# Patient Record
Sex: Female | Born: 2000 | Race: White | Hispanic: No | Marital: Single | Smoking: Never smoker
Health system: Southern US, Community
[De-identification: ages and names within clinical notes are randomized; demographics above are authoritative.]

## PROBLEM LIST (undated history)

## (undated) DIAGNOSIS — E213 Hyperparathyroidism, unspecified: Secondary | ICD-10-CM

## (undated) DIAGNOSIS — E559 Vitamin D deficiency, unspecified: Secondary | ICD-10-CM

## (undated) DIAGNOSIS — M5126 Other intervertebral disc displacement, lumbar region: Secondary | ICD-10-CM

## (undated) DIAGNOSIS — E282 Polycystic ovarian syndrome: Secondary | ICD-10-CM

## (undated) DIAGNOSIS — E538 Deficiency of other specified B group vitamins: Secondary | ICD-10-CM

## (undated) DIAGNOSIS — N200 Calculus of kidney: Secondary | ICD-10-CM

## (undated) DIAGNOSIS — F419 Anxiety disorder, unspecified: Secondary | ICD-10-CM

## (undated) HISTORY — PX: PARATHYROIDECTOMY: SHX19

## (undated) HISTORY — DX: Vitamin D deficiency, unspecified: E55.9

## (undated) HISTORY — DX: Hyperparathyroidism, unspecified: E21.3

## (undated) HISTORY — DX: Other intervertebral disc displacement, lumbar region: M51.26

## (undated) HISTORY — DX: Polycystic ovarian syndrome: E28.2

## (undated) HISTORY — DX: Deficiency of other specified B group vitamins: E53.8

## (undated) HISTORY — DX: Anxiety disorder, unspecified: F41.9

---
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
## 2018-10-22 ENCOUNTER — Emergency Department (HOSPITAL_COMMUNITY)
Admission: EM | Admit: 2018-10-22 | Discharge: 2018-10-23 | Disposition: A | Discharge status: Home / Self Care | Payer: Medicaid - Out of State | Attending: Emergency Medicine | Admitting: Emergency Medicine

## 2018-10-22 ENCOUNTER — Encounter (HOSPITAL_COMMUNITY): Payer: Self-pay | Admitting: Emergency Medicine

## 2018-10-22 ENCOUNTER — Other Ambulatory Visit: Payer: Self-pay

## 2018-10-22 DIAGNOSIS — R109 Unspecified abdominal pain: Secondary | ICD-10-CM | POA: Insufficient documentation

## 2018-10-22 DIAGNOSIS — R112 Nausea with vomiting, unspecified: Secondary | ICD-10-CM

## 2018-10-22 DIAGNOSIS — R1013 Epigastric pain: Secondary | ICD-10-CM

## 2018-10-22 DIAGNOSIS — Z91040 Latex allergy status: Secondary | ICD-10-CM | POA: Diagnosis not present

## 2018-10-22 DIAGNOSIS — Z79899 Other long term (current) drug therapy: Secondary | ICD-10-CM | POA: Insufficient documentation

## 2018-10-22 LAB — COMPREHENSIVE METABOLIC PANEL
ALT: 33 U/L (ref 0–44)
AST: 20 U/L (ref 15–41)
Albumin: 4.4 g/dL (ref 3.5–5.0)
Alkaline Phosphatase: 100 U/L (ref 38–126)
Anion gap: 10 (ref 5–15)
BUN: 13 mg/dL (ref 6–20)
CO2: 19 mmol/L — ABNORMAL LOW (ref 22–32)
Calcium: 8.9 mg/dL (ref 8.9–10.3)
Chloride: 108 mmol/L (ref 98–111)
Creatinine, Ser: 0.65 mg/dL (ref 0.44–1.00)
GFR calc Af Amer: >60 mL/min (ref >60)
GFR calc non Af Amer: >60 mL/min (ref >60)
Glucose, Bld: 120 mg/dL — ABNORMAL HIGH (ref 70–99)
Potassium: 3.6 mmol/L (ref 3.5–5.1)
Sodium: 137 mmol/L (ref 135–145)
Total Bilirubin: 1.3 mg/dL — ABNORMAL HIGH (ref 0.3–1.2)
Total Protein: 7.9 g/dL (ref 6.5–8.1)

## 2018-10-22 LAB — LIPASE, BLOOD: Lipase: 22 U/L (ref 11–51)

## 2018-10-22 LAB — CBC WITH DIFFERENTIAL/PLATELET
Abs Immature Granulocytes: 0.04 10*3/uL (ref 0.00–0.07)
Basophils Absolute: 0 10*3/uL (ref 0.0–0.1)
Basophils Relative: 0 %
Eosinophils Absolute: 0 10*3/uL (ref 0.0–0.5)
Eosinophils Relative: 0 %
HCT: 45.7 % (ref 36.0–46.0)
Hemoglobin: 15.8 g/dL — ABNORMAL HIGH (ref 12.0–15.0)
Immature Granulocytes: 0 %
Lymphocytes Relative: 6 %
Lymphs Abs: 0.7 10*3/uL (ref 0.7–4.0)
MCH: 31.9 pg (ref 26.0–34.0)
MCHC: 34.6 g/dL (ref 30.0–36.0)
MCV: 92.3 fL (ref 80.0–100.0)
Monocytes Absolute: 0.6 10*3/uL (ref 0.1–1.0)
Monocytes Relative: 5 %
Neutro Abs: 9.8 10*3/uL — ABNORMAL HIGH (ref 1.7–7.7)
Neutrophils Relative %: 89 %
Platelets: 327 10*3/uL (ref 150–400)
RBC: 4.95 MIL/uL (ref 3.87–5.11)
RDW: 13.9 % (ref 11.5–15.5)
WBC: 11.2 10*3/uL — ABNORMAL HIGH (ref 4.0–10.5)
nRBC: 0 % (ref 0.0–0.2)

## 2018-10-22 MED ORDER — SODIUM CHLORIDE 0.9 % IV BOLUS
1000.0000 mL | Freq: Once | INTRAVENOUS | Status: AC
  Administered 2018-10-22: 1000 mL via INTRAVENOUS

## 2018-10-22 MED ORDER — SODIUM CHLORIDE 0.9 % IV SOLN
INTRAVENOUS | Status: DC
  Administered 2018-10-23: via INTRAVENOUS

## 2018-10-22 MED ORDER — ONDANSETRON HCL 4 MG/2ML IJ SOLN
4.0000 mg | Freq: Once | INTRAMUSCULAR | Status: AC
  Administered 2018-10-22: 4 mg via INTRAVENOUS
  Filled 2018-10-22: qty 2

## 2018-10-22 MED ORDER — FENTANYL CITRATE (PF) 100 MCG/2ML IJ SOLN
50.0000 ug | Freq: Once | INTRAMUSCULAR | Status: AC
  Administered 2018-10-22: 50 ug via INTRAVENOUS
  Filled 2018-10-22: qty 2

## 2018-10-22 NOTE — ED Triage Notes (Signed)
Pt to ED with c/o of abd pain since yesterday and vomiting that started today. Unable to keep down any fluids today. Last urinated this morning. Just moved here at the beginning of March from [place].

## 2018-10-22 NOTE — ED Provider Notes (Signed)
[facility] EMERGENCY DEPARTMENT Provider Note   CSN: [id number] Arrival date & time: 10/22/18  2215    History   Chief Complaint Chief Complaint  Patient presents with  . Abdominal Pain  . Emesis    HPI Ashley Ponce is a 18 y.o. female.     HPI Patient presents to the emergency room for evaluation abdominal pain and vomiting.  Patient states she started having symptoms yesterday.  Today the symptoms have become more severe.  She is having pain in her upper abdomen.  Does not radiate.  She also started having numerous episodes of vomiting and is now feeling dehydrated.  She last urinated this morning.  She has not been able to keep anything down.  She denies any blood in her emesis.  She denies any diarrhea.  No vaginal discharge or bleeding.  No fevers or chills.  No history of prior abdominal problems and no prior surgeries. History reviewed. No pertinent past medical history.  There are no active problems to display for this patient.   History reviewed. No pertinent surgical history.   OB History   No obstetric history on file.      Home Medications    Prior to Admission medications   Medication Sig Start Date End Date Taking? Authorizing Provider  gabapentin (NEURONTIN) 300 MG capsule Take 300 mg by mouth daily as needed. For numbness 08/14/18  Yes [provider]    Family History No family history on file.  Social History Social History   Tobacco Use  . Smoking status: Not on file  Substance Use Topics  . Alcohol use: Not on file  . Drug use: Not on file     Allergies   Latex and Morphine and related   Review of Systems Review of Systems  All other systems reviewed and are negative.    Physical Exam Updated Vital Signs BP 121/77 (BP Location: Right Arm)   Pulse (!) 135   Temp 98.9 F (37.2 C) (Oral)   Resp 18   LMP 10/11/2018   SpO2 98%   Physical Exam Vitals signs and nursing note reviewed.  Constitutional:      General:  She is not in acute distress.    Appearance: She is well-developed.  HENT:     Head: Normocephalic and atraumatic.     Right Ear: External ear normal.     Left Ear: External ear normal.  Eyes:     General: No scleral icterus.       Right eye: No discharge.        Left eye: No discharge.     Conjunctiva/sclera: Conjunctivae normal.  Neck:     Musculoskeletal: Neck supple.     Trachea: No tracheal deviation.  Cardiovascular:     Rate and Rhythm: Regular rhythm. Tachycardia present.  Pulmonary:     Effort: Pulmonary effort is normal. No respiratory distress.     Breath sounds: Normal breath sounds. No stridor. No wheezing or rales.  Abdominal:     General: Bowel sounds are normal. There is no distension.     Palpations: Abdomen is soft.     Tenderness: There is abdominal tenderness in the epigastric area. There is no guarding or rebound.  Musculoskeletal:        General: No tenderness.  Skin:    General: Skin is warm and dry.     Findings: No rash.  Neurological:     Mental Status: She is alert.     Cranial  Nerves: No cranial nerve deficit (no facial droop, extraocular movements intact, no slurred speech).     Sensory: No sensory deficit.     Motor: No abnormal muscle tone or seizure activity.     Coordination: Coordination normal.      ED Treatments / Results  Labs (all labs ordered are listed, but only abnormal results are displayed) Labs Reviewed  CBC WITH DIFFERENTIAL/PLATELET - Abnormal; Notable for the following components:      Result Value   WBC 11.2 (*)    Hemoglobin 15.8 (*)    Neutro Abs 9.8 (*)    All other components within normal limits  COMPREHENSIVE METABOLIC PANEL  LIPASE, BLOOD  URINALYSIS, ROUTINE W REFLEX MICROSCOPIC  PREGNANCY, URINE     Radiology No results found.  Procedures Procedures (including critical care time)  Medications Ordered in ED Medications  sodium chloride 0.9 % bolus 1,000 mL (1,000 mLs Intravenous New Bag/Given  10/22/18 2303)    And  0.9 %  sodium chloride infusion (has no administration in time range)  ondansetron (ZOFRAN) injection 4 mg (4 mg Intravenous Given 10/22/18 2302)  fentaNYL (SUBLIMAZE) injection 50 mcg (50 mcg Intravenous Given 10/22/18 2302)     Initial Impression / Assessment and Plan / ED Course  I have reviewed the triage vital signs and the nursing notes.  Pertinent labs & imaging results that were available during my care of the patient were reviewed by me and considered in my medical decision making (see chart for details).   DDX includes pancreatitis, cholecystitis, gastritis, renal colic,  viral GE.  No lower abd pain. Doubt appendicitis.  Plan on fluids, antiemetics, pain meds and reassess.  Dr Erin HearingMessner will follow up on results. Final Clinical Impressions(s) / ED Diagnoses  pending   Linwood DibblesKnapp, Amber Williard, MD 10/22/18 2323

## 2018-10-22 NOTE — ED Notes (Signed)
Patient unable to urinate at this time. 

## 2018-10-23 ENCOUNTER — Emergency Department (HOSPITAL_COMMUNITY): Payer: Medicaid - Out of State

## 2018-10-23 LAB — URINALYSIS, ROUTINE W REFLEX MICROSCOPIC
Bilirubin Urine: NEGATIVE
Glucose, UA: NEGATIVE mg/dL
Hgb urine dipstick: NEGATIVE
Ketones, ur: 20 mg/dL — AB
Leukocytes,Ua: NEGATIVE
Nitrite: NEGATIVE
Protein, ur: NEGATIVE mg/dL
Specific Gravity, Urine: 1.028 (ref 1.005–1.030)
pH: 5 (ref 5.0–8.0)

## 2018-10-23 LAB — PREGNANCY, URINE: Preg Test, Ur: NEGATIVE

## 2018-10-23 MED ORDER — HYDROMORPHONE HCL 1 MG/ML IJ SOLN
0.5000 mg | Freq: Once | INTRAMUSCULAR | Status: AC
  Administered 2018-10-23: 0.5 mg via INTRAVENOUS
  Filled 2018-10-23: qty 1

## 2018-10-23 MED ORDER — IOHEXOL 300 MG/ML  SOLN
100.0000 mL | Freq: Once | INTRAMUSCULAR | Status: AC | PRN
  Administered 2018-10-23: 100 mL via INTRAVENOUS

## 2018-10-23 MED ORDER — METOCLOPRAMIDE HCL 10 MG PO TABS: 10.0000 mg | ORAL_TABLET | Freq: Four times a day (QID) | ORAL | 0 refills | Status: DC | PRN

## 2018-10-23 MED ORDER — SODIUM CHLORIDE 0.9 % IV BOLUS
1000.0000 mL | Freq: Once | INTRAVENOUS | Status: AC
  Administered 2018-10-23: 1000 mL via INTRAVENOUS

## 2018-10-23 MED ORDER — METOCLOPRAMIDE HCL 5 MG/ML IJ SOLN
10.0000 mg | Freq: Once | INTRAMUSCULAR | Status: AC
  Administered 2018-10-23: 10 mg via INTRAVENOUS
  Filled 2018-10-23: qty 2

## 2018-10-23 NOTE — ED Provider Notes (Signed)
1:52 AM Assumed care from Dr. Lynelle Doctor, please see their note for full history, physical and decision making until this point. In brief this is a 18 y.o. year old female who presented to the ED tonight with Abdominal Pain and Emesis     Had a recurrent episode of vomiting and pain so CT scan down and more medications given.  CT negative for any acute processes.  Abdominal exam benign.  Tolerating p.o.  Discharged on for possible duodenitis or gastritis or other causes.  No evidence of biliary disease I think the elevated bilirubin is likely from just general dehydration.  Patient discharged to follow-up as an outpatient return here if worsening symptoms.  Discharge instructions, including strict return precautions for new or worsening symptoms, given. Patient and/or family verbalized understanding and agreement with the plan as described.   Labs, studies and imaging reviewed by myself and considered in medical decision making if ordered. Imaging interpreted by radiology.  Labs Reviewed  COMPREHENSIVE METABOLIC PANEL - Abnormal; Notable for the following components:      Result Value   CO2 19 (*)    Glucose, Bld 120 (*)    Total Bilirubin 1.3 (*)    All other components within normal limits  CBC WITH DIFFERENTIAL/PLATELET - Abnormal; Notable for the following components:   WBC 11.2 (*)    Hemoglobin 15.8 (*)    Neutro Abs 9.8 (*)    All other components within normal limits  URINALYSIS, ROUTINE W REFLEX MICROSCOPIC - Abnormal; Notable for the following components:   Ketones, ur 20 (*)    All other components within normal limits  LIPASE, BLOOD  PREGNANCY, URINE    CT ABDOMEN PELVIS W CONTRAST  Final Result      No follow-ups on file.    Hershal Eriksson, Barbara Cower, MD 10/23/18 [phone]

## 2018-10-23 NOTE — ED Notes (Signed)
Patient transported to CT 

## 2018-10-23 NOTE — ED Notes (Signed)
Pt unable to urinate at this time.  

## 2019-04-06 ENCOUNTER — Other Ambulatory Visit: Payer: Self-pay

## 2019-04-06 ENCOUNTER — Ambulatory Visit
Admission: EM | Admit: 2019-04-06 | Discharge: 2019-04-06 | Disposition: A | Discharge status: Home / Self Care | Payer: Medicaid Other

## 2019-04-06 DIAGNOSIS — K146 Glossodynia: Secondary | ICD-10-CM

## 2019-04-06 MED ORDER — LIDOCAINE VISCOUS HCL 2 % MT SOLN: OROMUCOSAL | 0 refills | Status: DC

## 2019-04-06 NOTE — ED Provider Notes (Signed)
[place]   [id number] 04/06/19 Arrival Time: 0263  CC: Sore on tongue   SUBJECTIVE:  Ashley Ponce is a 18 y.o. female who presents with sore on tongue x 4-5 days.  Denies a precipitating event or trauma, states she was taking zofran under the tongue prior to symptoms.  Localizes pain to RT side of tongue.  Pain is constant and aching, 5/10. Has tried OTC analgesics with minimal relief.  Worse with eating and drinking.  Has an appt with PCP later this week, but was not able to wait. Denies similar symptoms in the past.  Denies fever, chills, dysphagia, odynophagia, oral or neck swelling, nausea, vomiting, chest pain, SOB.    ROS: As per HPI.  All other pertinent ROS negative.     History reviewed. No pertinent past medical history. History reviewed. No pertinent surgical history. Allergies  Allergen Reactions  . Latex Anaphylaxis  . Morphine And Related Hives   No current facility-administered medications on file prior to encounter.    Current Outpatient Medications on File Prior to Encounter  Medication Sig Dispense Refill  . norethindrone-ethinyl estradiol (LOESTRIN FE) 1-20 MG-MCG tablet Take 1 tablet by mouth daily.    . [DISCONTINUED] gabapentin (NEURONTIN) 300 MG capsule Take 300 mg by mouth daily as needed. For numbness    . [DISCONTINUED] metoCLOPramide (REGLAN) 10 MG tablet Take 1 tablet (10 mg total) by mouth every 6 (six) hours as needed for nausea. 30 tablet 0   Social History   Socioeconomic History  . Marital status: Single    Spouse name: Not on file  . Number of children: Not on file  . Years of education: Not on file  . Highest education level: Not on file  Occupational History  . Not on file  Social Needs  . Financial resource strain: Not on file  . Food insecurity    Worry: Not on file    Inability: Not on file  . Transportation needs    Medical: Not on file    Non-medical: Not on file  Tobacco Use  . Smoking status: Never Smoker  .  Smokeless tobacco: Never Used  Substance and Sexual Activity  . Alcohol use: Never    Frequency: Never  . Drug use: Never  . Sexual activity: Not on file  Lifestyle  . Physical activity    Days per week: Not on file    Minutes per session: Not on file  . Stress: Not on file  Relationships  . Social Herbalist on phone: Not on file    Gets together: Not on file    Attends religious service: Not on file    Active member of club or organization: Not on file    Attends meetings of clubs or organizations: Not on file    Relationship status: Not on file  . Intimate partner violence    Fear of current or ex partner: Not on file    Emotionally abused: Not on file    Physically abused: Not on file    Forced sexual activity: Not on file  Other Topics Concern  . Not on file  Social History Narrative  . Not on file   Family History  Problem Relation Age of Onset  . Hypertension Mother     OBJECTIVE:  Vitals:   04/06/19 1348  BP: 117/73  Pulse: 88  Resp: 16  Temp: 98.6 F (37 C)  TempSrc: Oral  SpO2: 97%    General appearance:  alert; appears uncomfortable but nontoxic HENT: normocephalic; atraumatic; no obvious facial swelling, tolerating own secretions without difficulty; PERRL, EOMI grossly; nares patent without obvious rhinorrhea; dentition: good to fair, filling back RT side; RT side of tongue with shallow sore with pale/ yellow base, TTP with tongue depressor; no other obvious sores or lesions around gums, cheeks, or lips  Neck: supple without LAD Lungs: normal respirations; CTAB CV: RRR Skin: warm and dry Psychological: alert and cooperative; normal mood and affect  ASSESSMENT & PLAN:  1. Tongue sore     Meds ordered this encounter  Medications  . lidocaine (XYLOCAINE) 2 % solution    Sig: Apply to sore on tongue with q-tip; use as needed for pain    Dispense:  15 mL    Refill:  0    Order Specific Question:   Supervising Provider    Answer:    Eustace Moore [[id number]]   Viscous lidocaine prescribed.  This is an oral solution you can use a q-tip to apply to sore on tongue as needed for pain.  Do not exceed 8 doses in a 24 hour period.  Use with caution prior to eating, as this will numb your tongue.    Recommend soft diet Maintain oral hygiene care Follow up with PCP as needed if symptoms persists Return or go to the ED if you have any new or worsening symptoms such as fever, chills, difficulty swallowing, painful swallowing, oral or neck swelling, nausea, vomiting, chest pain, SOB, etc...  Reviewed expectations re: course of current medical issues. Questions answered. Outlined signs and symptoms indicating need for more acute intervention. Patient verbalized understanding. After Visit Summary given.   Rennis Harding, PA-C 04/06/19 1430

## 2019-04-06 NOTE — Discharge Instructions (Signed)
Viscous lidocaine prescribed.  This is an oral solution you can use a q-tip to apply to sore on tongue as needed for pain.  Do not exceed 8 doses in a 24 hour period.  Use with caution prior to eating, as this will numb your tongue.    Recommend soft diet until evaluated by dentist Maintain oral hygiene care Follow up with PCP as needed if symptoms persists Return or go to the ED if you have any new or worsening symptoms such as fever, chills, difficulty swallowing, painful swallowing, oral or neck swelling, nausea, vomiting, chest pain, SOB, etc..Marland Kitchen

## 2019-04-06 NOTE — ED Triage Notes (Signed)
Pt presents to UC w/ c/o sore on right side of tongue that has been bothering her for 2-3 days. Pt states it started as painful ridges on sides of tongue and developed into a sore on right side of tongue. Pt states tongue feels large and swollen. Denies sore throat.

## 2019-04-23 ENCOUNTER — Ambulatory Visit (INDEPENDENT_AMBULATORY_CARE_PROVIDER_SITE_OTHER): Payer: Medicaid Other

## 2019-04-23 ENCOUNTER — Other Ambulatory Visit: Payer: Self-pay

## 2019-04-23 ENCOUNTER — Ambulatory Visit
Admission: EM | Admit: 2019-04-23 | Discharge: 2019-04-23 | Disposition: A | Discharge status: Home / Self Care | Payer: Medicaid Other | Attending: Emergency Medicine | Admitting: Emergency Medicine

## 2019-04-23 DIAGNOSIS — W19XXXA Unspecified fall, initial encounter: Secondary | ICD-10-CM

## 2019-04-23 DIAGNOSIS — M5442 Lumbago with sciatica, left side: Secondary | ICD-10-CM | POA: Diagnosis not present

## 2019-04-23 DIAGNOSIS — M545 Low back pain: Secondary | ICD-10-CM

## 2019-04-23 LAB — POCT URINE PREGNANCY: Preg Test, Ur: NEGATIVE

## 2019-04-23 MED ORDER — PREDNISONE 20 MG PO TABS: 20.0000 mg | ORAL_TABLET | Freq: Two times a day (BID) | ORAL | 0 refills | Status: AC

## 2019-04-23 MED ORDER — CYCLOBENZAPRINE HCL 10 MG PO TABS: 10.0000 mg | ORAL_TABLET | Freq: Every day | ORAL | 0 refills | Status: DC

## 2019-04-23 NOTE — Discharge Instructions (Signed)
X-rays did not show obvious fracture or dislocation.  Radiologist interpretation pending.  I will call you with abnormal results.   Rest, ice and heat as needed Ensure adequate ROM as tolerated. Injuries all appear to be muscular in nature. Prescribed prednisone as needed for inflammation and pain relief Prescribed flexeril as needed at bedtime for muscle spasm.  Do not drive or operate heavy machinery while taking this medication It may take 3-4 weeks for complete resolution of symptoms Will f/u with her doctor or here if not seeing significant improvement within one week. Return here or go to ER if you have any new or worsening symptoms such as numbness/tingling of the inner thighs, loss of bladder or bowel control, headache/blurry vision, nausea/vomiting, confusion/altered mental status, dizziness, weakness, passing out, imbalance, etc..Marland Kitchen
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
## 2019-09-25 ENCOUNTER — Encounter: Payer: Self-pay | Admitting: Emergency Medicine

## 2019-09-25 ENCOUNTER — Ambulatory Visit
Admission: EM | Admit: 2019-09-25 | Discharge: 2019-09-25 | Disposition: A | Discharge status: Home / Self Care | Payer: Medicaid Other

## 2019-09-25 ENCOUNTER — Other Ambulatory Visit: Payer: Self-pay

## 2019-09-25 DIAGNOSIS — Z20822 Contact with and (suspected) exposure to covid-19: Secondary | ICD-10-CM

## 2019-09-25 DIAGNOSIS — R0602 Shortness of breath: Secondary | ICD-10-CM

## 2019-09-25 DIAGNOSIS — J069 Acute upper respiratory infection, unspecified: Secondary | ICD-10-CM

## 2019-09-25 MED ORDER — ALBUTEROL SULFATE HFA 108 (90 BASE) MCG/ACT IN AERS: 1.0000 | INHALATION_SPRAY | Freq: Four times a day (QID) | RESPIRATORY_TRACT | 0 refills | Status: DC | PRN

## 2019-09-25 MED ORDER — CETIRIZINE HCL 10 MG PO TABS: 10.0000 mg | ORAL_TABLET | Freq: Every day | ORAL | 0 refills | Status: DC

## 2019-09-25 MED ORDER — BENZONATATE 100 MG PO CAPS: 100.0000 mg | ORAL_CAPSULE | Freq: Three times a day (TID) | ORAL | 0 refills | Status: DC

## 2019-09-25 MED ORDER — FLUTICASONE PROPIONATE 50 MCG/ACT NA SUSP: 2.0000 | Freq: Every day | NASAL | 0 refills | Status: DC

## 2019-09-25 MED ORDER — PREDNISONE 20 MG PO TABS: 20.0000 mg | ORAL_TABLET | Freq: Two times a day (BID) | ORAL | 0 refills | Status: DC

## 2019-09-25 NOTE — ED Provider Notes (Signed)
[facility]   [id number] 09/25/19 Arrival Time: 1026   CC: COVID symptoms  SUBJECTIVE: History from: patient.  Ashley Ponce is a 19 y.o. female who presents with runny nose, congestion, productive cough (unsure of color), sore throat, body aches, fatigue, fever with tmax of 101.3, x 4 days, and SOB x 1 day.  Denies sick exposure to COVID, flu or strep.  Denies recent travel.  Was seen at [facility] in [place].  Had negative rapid COVID, strep and flu.  Prescribed antibiotic, cefdinir without relief.  Symptoms are made worse with deep breath.  Denies previous symptoms in the past.   Denies wheezing, chest pain, nausea, changes in bowel or bladder habits.    Denies recent long travel, calf pain or swelling, tobacco use, pregnancy, malignancy, recent surgery, hx of blood clots.   Currently on BC   ROS: As per HPI.  All other pertinent ROS negative.     History reviewed. No pertinent past medical history. History reviewed. No pertinent surgical history. Allergies  Allergen Reactions  . Latex Anaphylaxis  . Morphine And Related Hives   No current facility-administered medications on file prior to encounter.   Current Outpatient Medications on File Prior to Encounter  Medication Sig Dispense Refill  . cefdinir (OMNICEF) 300 MG capsule Take 300 mg by mouth 2 (two) times daily.    . norethindrone-ethinyl estradiol (LOESTRIN FE) 1-20 MG-MCG tablet Take 1 tablet by mouth daily.    . [DISCONTINUED] gabapentin (NEURONTIN) 300 MG capsule Take 300 mg by mouth daily as needed. For numbness    . [DISCONTINUED] metoCLOPramide (REGLAN) 10 MG tablet Take 1 tablet (10 mg total) by mouth every 6 (six) hours as needed for nausea. 30 tablet 0   Social History   Socioeconomic History  . Marital status: Single    Spouse name: Not on file  . Number of children: Not on file  . Years of education: Not on file  . Highest education level: Not on file  Occupational History  . Not on file    Tobacco Use  . Smoking status: Never Smoker  . Smokeless tobacco: Never Used  Substance and Sexual Activity  . Alcohol use: Never  . Drug use: Never  . Sexual activity: Not on file  Other Topics Concern  . Not on file  Social History Narrative  . Not on file   Social Determinants of Health   Financial Resource Strain:   . Difficulty of Paying Living Expenses:   Food Insecurity:   . Worried About Programme researcher, broadcasting/film/video in the Last Year:   . Barista in the Last Year:   Transportation Needs:   . Freight forwarder (Medical):   Marland Kitchen Lack of Transportation (Non-Medical):   Physical Activity:   . Days of Exercise per Week:   . Minutes of Exercise per Session:   Stress:   . Feeling of Stress :   Social Connections:   . Frequency of Communication with Friends and Family:   . Frequency of Social Gatherings with Friends and Family:   . Attends Religious Services:   . Active Member of Clubs or Organizations:   . Attends Banker Meetings:   Marland Kitchen Marital Status:   Intimate Partner Violence:   . Fear of Current or Ex-Partner:   . Emotionally Abused:   Marland Kitchen Physically Abused:   . Sexually Abused:    Family History  Problem Relation Age of Onset  . Hypertension Mother  OBJECTIVE:  Vitals:   09/25/19 1108  BP: 120/79  Pulse: 97  Resp: 16  Temp: 97.8 F (36.6 C)  TempSrc: Oral  SpO2: 98%     General appearance: alert; appears fatigued, but nontoxic; speaking in full sentences and tolerating own secretions HEENT: NCAT; Ears: EACs clear, TMs pearly gray; Eyes: PERRL.  EOM grossly intact. Nose: nares patent with copious rhinorrhea, Throat: oropharynx clear, tonsils non erythematous or enlarged, uvula midline  Neck: supple without LAD Lungs: unlabored respirations, symmetrical air entry; cough: mild; no respiratory distress; CTAB, quiet breath sounds throughout Heart: regular rate and rhythm.   Skin: warm and dry Psychological: alert and cooperative; normal  mood and affect   ASSESSMENT & PLAN:  1. Suspected COVID-19 virus infection   2. Viral URI with cough   3. Shortness of breath     Meds ordered this encounter  Medications  . cetirizine (ZYRTEC) 10 MG tablet    Sig: Take 1 tablet (10 mg total) by mouth daily.    Dispense:  30 tablet    Refill:  0    Order Specific Question:   Supervising Provider    Answer:   Eustace Moore [[id number]]  . fluticasone (FLONASE) 50 MCG/ACT nasal spray    Sig: Place 2 sprays into both nostrils daily.    Dispense:  16 g    Refill:  0    Order Specific Question:   Supervising Provider    Answer:   Eustace Moore [[id number]]  . benzonatate (TESSALON) 100 MG capsule    Sig: Take 1 capsule (100 mg total) by mouth every 8 (eight) hours.    Dispense:  21 capsule    Refill:  0    Order Specific Question:   Supervising Provider    Answer:   Eustace Moore [[id number]]  . predniSONE (DELTASONE) 20 MG tablet    Sig: Take 1 tablet (20 mg total) by mouth 2 (two) times daily with a meal for 5 days.    Dispense:  10 tablet    Refill:  0    Order Specific Question:   Supervising Provider    Answer:   Eustace Moore [[id number]]  . albuterol (VENTOLIN HFA) 108 (90 Base) MCG/ACT inhaler    Sig: Inhale 1-2 puffs into the lungs every 6 (six) hours as needed for wheezing or shortness of breath.    Dispense:  18 g    Refill:  0    Order Specific Question:   Supervising Provider    Answer:   Eustace Moore [[id number]]   Unable to rule out blood clot in urgent care setting.  Offered patient further evaluation and management in the ED.  Patient declines at this time and would like to try outpatient therapy first.  Aware of the risk associated with this decision including missed diagnosis, organ damage, organ failure, and/or death.  Patient aware and in agreement.     Send out covid test ordered.  It should take 3-5 days for results Culture sent.  Patient should remain in quarantine until they have received  culture results.  If negative you may resume normal activities (go back to work/school) while practicing hand hygiene, social distance, and mask wearing.  If positive, patient should remain in quarantine for 10 days from symptom onset AND greater than 72 hours after symptoms resolution (absence of fever without the use of fever-reducing medication and improvement in respiratory symptoms), whichever is longer Get plenty of rest and push fluids Zyrtec  for nasal congestion, runny nose, and/or sore throat Flonase for nasal congestion and runny nose Tessalon perles for cough Prednisone prescribed.  Take as directed and to completion Albuterol inhaler prescribed as needed for shortness of breath and/or wheezing Use medications daily for symptom relief Use OTC medications like ibuprofen or tylenol as needed fever or pain Call or go to the ED if you have any new or worsening symptoms such as fever, worsening cough, shortness of breath, chest tightness, chest pain, turning blue, changes in mental status, etc...    Reviewed expectations re: course of current medical issues. Questions answered. Outlined signs and symptoms indicating need for more acute intervention. Patient verbalized understanding. After Visit Summary given.         Lestine Box, PA-C 09/25/19 1218

## 2019-09-25 NOTE — Discharge Instructions (Signed)
Unable to rule out blood clot in urgent care setting.  Offered patient further evaluation and management in the ED.  Patient declines at this time and would like to try outpatient therapy first.  Aware of the risk associated with this decision including missed diagnosis, organ damage, organ failure, and/or death.  Patient aware and in agreement.     Send out covid test ordered.  It should take 3-5 days for results Culture sent.  Patient should remain in quarantine until they have received culture results.  If negative you may resume normal activities (go back to work/school) while practicing hand hygiene, social distance, and mask wearing.  If positive, patient should remain in quarantine for 10 days from symptom onset AND greater than 72 hours after symptoms resolution (absence of fever without the use of fever-reducing medication and improvement in respiratory symptoms), whichever is longer Get plenty of rest and push fluids Zyrtec for nasal congestion, runny nose, and/or sore throat Flonase for nasal congestion and runny nose Tessalon perles for cough Prednisone prescribed.  Take as directed and to completion Albuterol inhaler prescribed as needed for shortness of breath and/or wheezing Use medications daily for symptom relief Use OTC medications like ibuprofen or tylenol as needed fever or pain Call or go to the ED if you have any new or worsening symptoms such as fever, worsening cough, shortness of breath, chest tightness, chest pain, turning blue, changes in mental status, etc..Marland Kitchen

## 2019-09-26 LAB — NOVEL CORONAVIRUS, NAA: SARS-CoV-2, NAA: NOT DETECTED

## 2019-09-30 ENCOUNTER — Ambulatory Visit (INDEPENDENT_AMBULATORY_CARE_PROVIDER_SITE_OTHER): Payer: Medicaid Other

## 2019-09-30 ENCOUNTER — Other Ambulatory Visit: Payer: Self-pay

## 2019-09-30 ENCOUNTER — Ambulatory Visit
Admission: EM | Admit: 2019-09-30 | Discharge: 2019-09-30 | Disposition: A | Discharge status: Home / Self Care | Payer: Medicaid Other | Attending: Emergency Medicine | Admitting: Emergency Medicine

## 2019-09-30 DIAGNOSIS — R059 Cough, unspecified: Secondary | ICD-10-CM

## 2019-09-30 DIAGNOSIS — R0981 Nasal congestion: Secondary | ICD-10-CM

## 2019-09-30 DIAGNOSIS — J0100 Acute maxillary sinusitis, unspecified: Secondary | ICD-10-CM | POA: Diagnosis not present

## 2019-09-30 DIAGNOSIS — R05 Cough: Secondary | ICD-10-CM | POA: Diagnosis not present

## 2019-09-30 DIAGNOSIS — J3489 Other specified disorders of nose and nasal sinuses: Secondary | ICD-10-CM

## 2019-09-30 MED ORDER — PREDNISONE 10 MG (21) PO TBPK: ORAL_TABLET | Freq: Every day | ORAL | 0 refills | Status: DC

## 2019-09-30 MED ORDER — DOXYCYCLINE HYCLATE 100 MG PO CAPS: 100.0000 mg | ORAL_CAPSULE | Freq: Two times a day (BID) | ORAL | 0 refills | Status: DC

## 2019-09-30 NOTE — Discharge Instructions (Addendum)
Unable to rule out blood clot in urgent care setting.  Offered patient further evaluation and management in the ED.  Patient declines at this time and would like to try outpatient therapy first.  Aware of the risk associated with this decision including missed diagnosis, organ damage, organ failure, and/or death.  Patient aware and in agreement.     Rest and push fluids Prednisone prescribed.  Take as directed and to completion.   Doxycycline prescribed.  Take as directed and to completion Continue with OTC ibuprofen/tylenol as needed for pain Go to the ED if you have any new or worsening symptoms such as fever, chills, worsening sinus pain/pressure, cough, sore throat, chest pain, shortness of breath, abdominal pain, changes in bowel or bladder habits, etc..Marland Kitchen

## 2019-09-30 NOTE — ED Provider Notes (Signed)
[facility]   [id number] 09/30/19 Arrival Time: 0841  Cc: COUGH  SUBJECTIVE:  Ashley Ponce is a 19 y.o. female who presents with stable persistent runny nose, congestion, and productive cough (unsure what color) x 6 days.  Denies positive sick exposure or precipitating event.  Denies tobacco use.  Has tried cefidinir, prednisone, albuterol inhaler, and tessalon with minimal relief.  Symptoms are made worse with deep breath.  Denies previous symptoms in the past.   Denies fever, chills, sore throat, SOB, wheezing, chest pain, nausea, vomiting, changes in bowel or bladder habits.    ROS: As per HPI.  All other pertinent ROS negative.     History reviewed. No pertinent past medical history. History reviewed. No pertinent surgical history. Allergies  Allergen Reactions  . Latex Anaphylaxis  . Morphine And Related Hives   No current facility-administered medications on file prior to encounter.   Current Outpatient Medications on File Prior to Encounter  Medication Sig Dispense Refill  . albuterol (VENTOLIN HFA) 108 (90 Base) MCG/ACT inhaler Inhale 1-2 puffs into the lungs every 6 (six) hours as needed for wheezing or shortness of breath. 18 g 0  . cetirizine (ZYRTEC) 10 MG tablet Take 1 tablet (10 mg total) by mouth daily. 30 tablet 0  . fluticasone (FLONASE) 50 MCG/ACT nasal spray Place 2 sprays into both nostrils daily. 16 g 0  . norethindrone-ethinyl estradiol (LOESTRIN FE) 1-20 MG-MCG tablet Take 1 tablet by mouth daily.    . [DISCONTINUED] gabapentin (NEURONTIN) 300 MG capsule Take 300 mg by mouth daily as needed. For numbness    . [DISCONTINUED] metoCLOPramide (REGLAN) 10 MG tablet Take 1 tablet (10 mg total) by mouth every 6 (six) hours as needed for nausea. 30 tablet 0    Social History   Socioeconomic History  . Marital status: Single    Spouse name: Not on file  . Number of children: Not on file  . Years of education: Not on file  . Highest education level: Not  on file  Occupational History  . Not on file  Tobacco Use  . Smoking status: Never Smoker  . Smokeless tobacco: Never Used  Substance and Sexual Activity  . Alcohol use: Never  . Drug use: Never  . Sexual activity: Not on file  Other Topics Concern  . Not on file  Social History Narrative  . Not on file   Social Determinants of Health   Financial Resource Strain:   . Difficulty of Paying Living Expenses:   Food Insecurity:   . Worried About Programme researcher, broadcasting/film/video in the Last Year:   . Barista in the Last Year:   Transportation Needs:   . Freight forwarder (Medical):   Marland Kitchen Lack of Transportation (Non-Medical):   Physical Activity:   . Days of Exercise per Week:   . Minutes of Exercise per Session:   Stress:   . Feeling of Stress :   Social Connections:   . Frequency of Communication with Friends and Family:   . Frequency of Social Gatherings with Friends and Family:   . Attends Religious Services:   . Active Member of Clubs or Organizations:   . Attends Banker Meetings:   Marland Kitchen Marital Status:   Intimate Partner Violence:   . Fear of Current or Ex-Partner:   . Emotionally Abused:   Marland Kitchen Physically Abused:   . Sexually Abused:    Family History  Problem Relation Age of Onset  . Hypertension  Mother      OBJECTIVE:  Vitals:   09/30/19 0853 09/30/19 0929  BP: 137/83   Pulse: (!) 127 92  Resp: 19   Temp: 98.6 F (37 C)   SpO2: 98%     HR improved to 92 bpm in room  General appearance: Alert, appears fatigued, but nontoxic; speaking in full sentences without difficulty HEENT:NCAT; Ears: EACs clear, TMs pearly gray; Eyes: PERRL.  EOM grossly intact. Sinuses: mildly TTP over maxillary sinuses Nose: nares patent without rhinorrhea; Throat: tonsils nonerythematous or enlarged, uvula midline  Neck: supple without LAD Lungs: clear to auscultation bilaterally without adventitious breath sounds; normal respiratory effort; mild cough present Heart:  regular rate and rhythm.   Skin: warm and dry Psychological: alert and cooperative; normal mood and affect  DIAGNOSTIC STUDIES:  DG Chest 2 View  Result Date: 09/30/2019 CLINICAL DATA:  Several week history of cough. EXAM: CHEST - 2 VIEW COMPARISON:  None. FINDINGS: The cardiac silhouette, mediastinal and hilar contours are within normal limits. There is mild peribronchial thickening and slight increased interstitial markings which could suggest bronchitis or changes related to smoking. No infiltrates or effusions. No worrisome pulmonary lesions. The bony thorax is intact. IMPRESSION: Mild peribronchial thickening and increased interstitial markings could suggest bronchitis or changes related to smoking. No infiltrates or effusions. Electronically Signed   By: Marijo Sanes M.D.   On: 09/30/2019 09:23    X-rays positive for bronchitis changes.    I have reviewed the x-rays myself and the radiologist interpretation. I am in agreement with the radiologist interpretation.     ASSESSMENT & PLAN:  1. Acute non-recurrent maxillary sinusitis   2. Cough   3. Stuffy and runny nose   4. Sinus congestion     Meds ordered this encounter  Medications  . predniSONE (STERAPRED UNI-PAK 21 TAB) 10 MG (21) TBPK tablet    Sig: Take by mouth daily. Take 6 tabs by mouth daily  for 2 days, then 5 tabs for 2 days, then 4 tabs for 2 days, then 3 tabs for 2 days, 2 tabs for 2 days, then 1 tab by mouth daily for 2 days    Dispense:  42 tablet    Refill:  0    Order Specific Question:   Supervising Provider    Answer:   Raylene Everts [[id number]]  . doxycycline (VIBRAMYCIN) 100 MG capsule    Sig: Take 1 capsule (100 mg total) by mouth 2 (two) times daily.    Dispense:  20 capsule    Refill:  0    Order Specific Question:   Supervising Provider    Answer:   Raylene Everts [[id number]]    Orders Placed This Encounter  Procedures  . DG Chest 2 View    Standing Status:   Standing    Number of  Occurrences:   1    Order Specific Question:   Reason for Exam (SYMPTOM  OR DIAGNOSIS REQUIRED)    Answer:   persistent productive cough    Unable to rule out blood clot in urgent care setting.  Offered patient further evaluation and management in the ED.  Patient declines at this time and would like to try outpatient therapy first.  Aware of the risk associated with this decision including missed diagnosis, organ damage, organ failure, and/or death.  Patient aware and in agreement.     Rest and push fluids Prednisone prescribed.  Take as directed and to completion.   Doxycycline prescribed.  Take as directed and to completion Continue with OTC ibuprofen/tylenol as needed for pain Go to the ED if you have any new or worsening symptoms such as fever, chills, worsening sinus pain/pressure, cough, sore throat, chest pain, shortness of breath, abdominal pain, changes in bowel or bladder habits, etc...    Reviewed expectations re: course of current medical issues. Questions answered. Outlined signs and symptoms indicating need for more acute intervention. Patient verbalized understanding. After Visit Summary given.          Rennis Harding, PA-C 09/30/19 0945

## 2019-09-30 NOTE — ED Triage Notes (Signed)
Pt presents with complaints of follow up for cough. Reports she was here on 3/19 and tested for covid which was negative. The patient states she was supposed to go back to work on 3/22 but is not feeling better. Reports continued productive cough, congestion and runny nose.

## 2020-12-21 ENCOUNTER — Ambulatory Visit: Payer: Medicaid Other | Admitting: "Endocrinology

## 2021-01-05 NOTE — Congregational Nurse Program (Signed)
Late entry  Pt attended [facility]  on 6.29.22 per a referral from [facility] to get connected to the uninsured program   Pt states she is currently seeing at [facility], PCP.  States per her provider that she will be needing a referral to a back specialist and endrocrinologist due to abnormal findings with her back and blood work .  States she was most recently seen at [facility]) on 6.27.22.  Plan -Enrollment and eligbility was completed on today for [facility] program   -Referral sent to RN Nurse Case Manager, Norval Gable, for initial follow up and continous medical case management after first appointment with the [facility] [place] [place].    -No Socio-determinant needs at this time identified or requested.

## 2021-01-17 DIAGNOSIS — E282 Polycystic ovarian syndrome: Secondary | ICD-10-CM | POA: Insufficient documentation

## 2021-08-27 IMAGING — DX DG CHEST 2V
2 series · 2 of 2 positions shown · non-contrast
Comparison: None.

CLINICAL DATA: Several week history of cough.

EXAM:
CHEST - 2 VIEW

[chest pa]
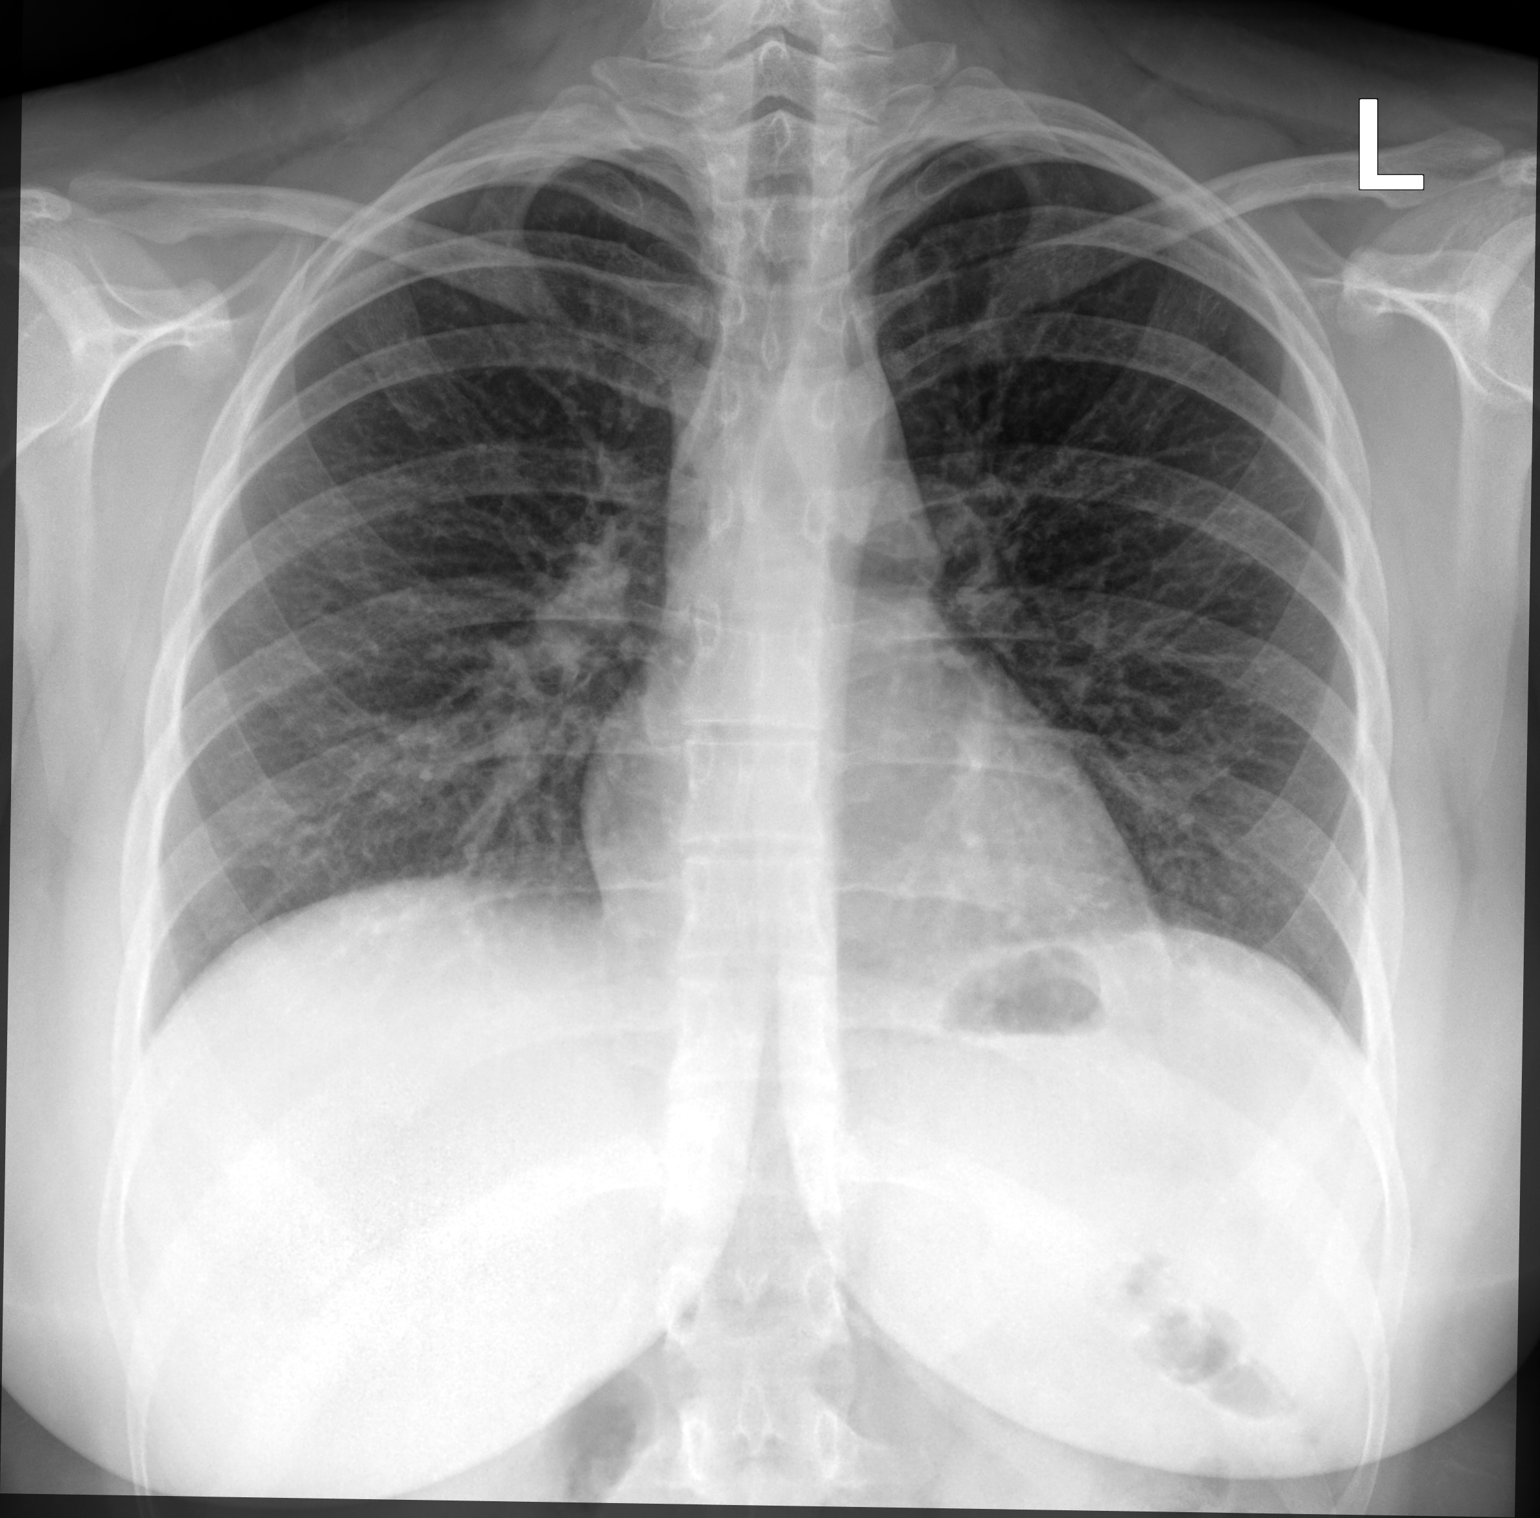

[chest lat]
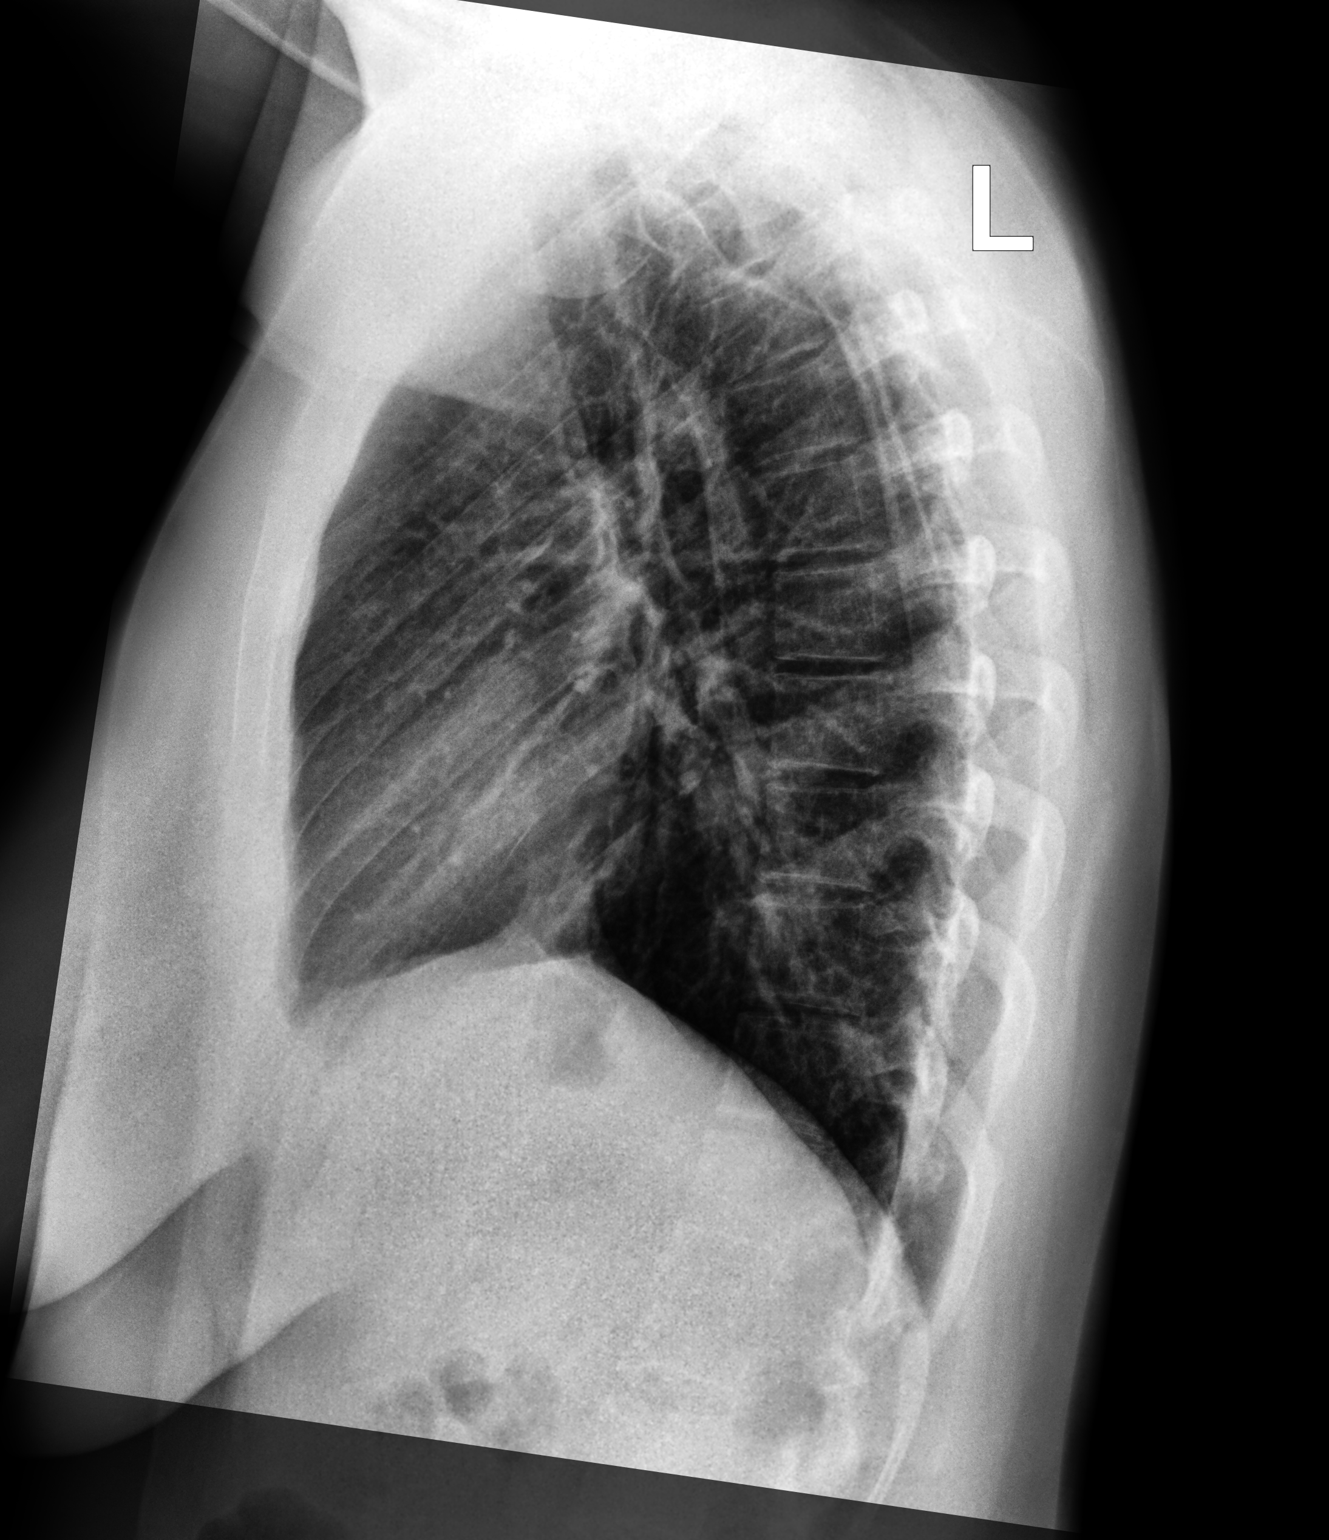

[2 of 2 positions shown; findings below may reference images not displayed]

FINDINGS: The cardiac silhouette, mediastinal and hilar contours are within
normal limits. There is mild peribronchial thickening and slight
increased interstitial markings which could suggest bronchitis or
changes related to smoking. No infiltrates or effusions. No
worrisome pulmonary lesions. The bony thorax is intact.
IMPRESSION: Mild peribronchial thickening and increased interstitial markings
could suggest bronchitis or changes related to smoking. No
infiltrates or effusions.

## 2022-07-10 ENCOUNTER — Emergency Department (HOSPITAL_COMMUNITY)
Admission: EM | Admit: 2022-07-10 | Discharge: 2022-07-11 | Discharge status: Against Medical Advice | Payer: Medicaid - Out of State | Attending: Emergency Medicine | Admitting: Emergency Medicine

## 2022-07-10 ENCOUNTER — Encounter (HOSPITAL_COMMUNITY): Payer: Self-pay | Admitting: Emergency Medicine

## 2022-07-10 ENCOUNTER — Other Ambulatory Visit: Payer: Self-pay

## 2022-07-10 DIAGNOSIS — Z5321 Procedure and treatment not carried out due to patient leaving prior to being seen by health care provider: Secondary | ICD-10-CM | POA: Diagnosis not present

## 2022-07-10 DIAGNOSIS — R109 Unspecified abdominal pain: Secondary | ICD-10-CM | POA: Diagnosis present

## 2022-07-10 DIAGNOSIS — R110 Nausea: Secondary | ICD-10-CM | POA: Insufficient documentation

## 2022-07-10 HISTORY — DX: Calculus of kidney: N20.0

## 2022-07-10 NOTE — ED Triage Notes (Addendum)
Pt c/o left sided abd pain and left flank pain with nausea since Friday, pt denies urinary symptoms/fever/v/d; pt reports hx of kidney stones but states this feels different

## 2022-07-31 ENCOUNTER — Ambulatory Visit: Payer: Medicaid Other | Admitting: Family Medicine

## 2022-08-01 ENCOUNTER — Ambulatory Visit: Payer: Medicaid - Out of State | Admitting: Family Medicine

## 2022-08-07 ENCOUNTER — Encounter: Payer: Self-pay | Admitting: Nurse Practitioner

## 2022-08-07 ENCOUNTER — Ambulatory Visit: Payer: PRIVATE HEALTH INSURANCE | Admitting: Nurse Practitioner

## 2022-08-07 VITALS — BP 104/70 | HR 81 | Temp 96.7°F | Ht 67.0 in | Wt 226.4 lb

## 2022-08-07 DIAGNOSIS — E538 Deficiency of other specified B group vitamins: Secondary | ICD-10-CM

## 2022-08-07 DIAGNOSIS — E559 Vitamin D deficiency, unspecified: Secondary | ICD-10-CM

## 2022-08-07 DIAGNOSIS — R350 Frequency of micturition: Secondary | ICD-10-CM

## 2022-08-07 DIAGNOSIS — Z13220 Encounter for screening for lipoid disorders: Secondary | ICD-10-CM

## 2022-08-07 DIAGNOSIS — R599 Enlarged lymph nodes, unspecified: Secondary | ICD-10-CM

## 2022-08-07 DIAGNOSIS — M5126 Other intervertebral disc displacement, lumbar region: Secondary | ICD-10-CM | POA: Diagnosis not present

## 2022-08-07 DIAGNOSIS — Z8639 Personal history of other endocrine, nutritional and metabolic disease: Secondary | ICD-10-CM

## 2022-08-07 DIAGNOSIS — F419 Anxiety disorder, unspecified: Secondary | ICD-10-CM | POA: Diagnosis not present

## 2022-08-07 DIAGNOSIS — E282 Polycystic ovarian syndrome: Secondary | ICD-10-CM

## 2022-08-07 LAB — CBC WITH DIFFERENTIAL/PLATELET
Basophils Absolute: 0 10*3/uL (ref 0.0–0.1)
Basophils Relative: 0.8 % (ref 0.0–3.0)
Eosinophils Absolute: 0.1 10*3/uL (ref 0.0–0.7)
Eosinophils Relative: 1 % (ref 0.0–5.0)
HCT: 45.5 % (ref 36.0–46.0)
Hemoglobin: 15.6 g/dL — ABNORMAL HIGH (ref 12.0–15.0)
Lymphocytes Relative: 31.4 % (ref 12.0–46.0)
Lymphs Abs: 1.9 10*3/uL (ref 0.7–4.0)
MCHC: 34.2 g/dL (ref 30.0–36.0)
MCV: 93.8 fl (ref 78.0–100.0)
Monocytes Absolute: 0.4 10*3/uL (ref 0.1–1.0)
Monocytes Relative: 6.4 % (ref 3.0–12.0)
Neutro Abs: 3.6 10*3/uL (ref 1.4–7.7)
Neutrophils Relative %: 60.4 % (ref 43.0–77.0)
Platelets: 383 10*3/uL (ref 150.0–400.0)
RBC: 4.85 Mil/uL (ref 3.87–5.11)
RDW: 13.9 % (ref 11.5–15.5)
WBC: 6 10*3/uL (ref 4.0–10.5)

## 2022-08-07 LAB — LIPID PANEL
Cholesterol: 185 mg/dL (ref 0–200)
HDL: 39 mg/dL — ABNORMAL LOW (ref >39.00)
LDL Cholesterol: 124 mg/dL — ABNORMAL HIGH (ref 0–99)
NonHDL: 146.34
Total CHOL/HDL Ratio: 5
Triglycerides: 110 mg/dL (ref 0.0–149.0)
VLDL: 22 mg/dL (ref 0.0–40.0)

## 2022-08-07 LAB — COMPREHENSIVE METABOLIC PANEL
ALT: 32 U/L (ref 0–35)
AST: 17 U/L (ref 0–37)
Albumin: 4.7 g/dL (ref 3.5–5.2)
Alkaline Phosphatase: 82 U/L (ref 39–117)
BUN: 13 mg/dL (ref 6–23)
CO2: 25 mEq/L (ref 19–32)
Calcium: 9.5 mg/dL (ref 8.4–10.5)
Chloride: 103 mEq/L (ref 96–112)
Creatinine, Ser: 0.68 mg/dL (ref 0.40–1.20)
GFR: 122.94 mL/min (ref >60.00)
Glucose, Bld: 89 mg/dL (ref 70–99)
Potassium: 4.2 mEq/L (ref 3.5–5.1)
Sodium: 137 mEq/L (ref 135–145)
Total Bilirubin: 0.5 mg/dL (ref 0.2–1.2)
Total Protein: 7.4 g/dL (ref 6.0–8.3)

## 2022-08-07 LAB — HM HIV SCREENING LAB: HM HIV Screening: NEGATIVE

## 2022-08-07 LAB — POCT URINALYSIS DIPSTICK
Bilirubin, UA: NEGATIVE
Blood, UA: NEGATIVE
Glucose, UA: NEGATIVE
Ketones, UA: NEGATIVE
Leukocytes, UA: NEGATIVE
Nitrite, UA: NEGATIVE
Protein, UA: POSITIVE — AB
Spec Grav, UA: >=1.03 — AB (ref 1.010–1.025)
Urobilinogen, UA: NEGATIVE E.U./dL — AB
pH, UA: 5 (ref 5.0–8.0)

## 2022-08-07 LAB — VITAMIN B12: Vitamin B-12: 333 pg/mL (ref 211–911)

## 2022-08-07 LAB — VITAMIN D 25 HYDROXY (VIT D DEFICIENCY, FRACTURES): VITD: 24.36 ng/mL — ABNORMAL LOW (ref 30.00–100.00)

## 2022-08-07 LAB — HM HEPATITIS C SCREENING LAB: HM Hepatitis Screen: NEGATIVE

## 2022-08-07 LAB — TSH: TSH: 1.58 u[IU]/mL (ref 0.35–5.50)

## 2022-08-07 LAB — T4, FREE: Free T4: 0.81 ng/dL (ref 0.60–1.60)

## 2022-08-07 NOTE — Assessment & Plan Note (Signed)
She has a swollen lymph node in her left inguinal region.  She did have pyelonephritis recently, most likely from this.  She can use warm compresses to the area.  Follow-up if symptoms worsen or with any concerns.

## 2022-08-07 NOTE — Assessment & Plan Note (Signed)
She has a history of vitamin B12 deficiency requiring injections.  She has not had any injections recently.  Will check vitamin B12 levels and treat based on results.

## 2022-08-07 NOTE — Assessment & Plan Note (Addendum)
Will check vitamin D levels and treat based on results.  She states that she did need vitamin D prescription once a week in the past.  She prefers this treatment if needed because she has a hard time remembering taking pills daily.

## 2022-08-07 NOTE — Assessment & Plan Note (Signed)
She is establishing with GYN next month.

## 2022-08-07 NOTE — Patient Instructions (Signed)
It was great to see you!  We are checking your labs today and will let you know the results via mychart/phone.   You can use warm compresses on your groin as needed. Let me know if the bump gets bigger or more painful.   Let's follow-up in 6 months, sooner if you have concerns.  If a referral was placed today, you will be contacted for an appointment. Please note that routine referrals can sometimes take up to 3-4 weeks to process. Please call our office if you haven't heard anything after this time frame.  Take care,  Vance Peper, NP
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
## 2022-08-07 NOTE — Assessment & Plan Note (Signed)
She has a history of primary hyperparathyroidism and had one of her parathyroids surgically removed.  Will check CMP, CBC, intact PTH, TSH, free T4 today.

## 2022-08-07 NOTE — Assessment & Plan Note (Signed)
Chronic, stable.  She is currently following with psychiatry and a therapist once a week.  Her PHQ-9 is a 4 and her GAD-7 is a 2.  She is not currently taking any medications and her symptoms are overall well-controlled.  Continue following with specialist.

## 2022-08-08 ENCOUNTER — Telehealth: Payer: Self-pay | Admitting: Nurse Practitioner

## 2022-08-08 LAB — URINE CULTURE
MICRO NUMBER:: 14493058
SPECIMEN QUALITY:: ADEQUATE

## 2022-08-08 NOTE — Telephone Encounter (Signed)
Caller Name: Santiago Glad w/[facility] Call back phone #: [phone] Session # [id number]  Reason for Call: received frozen tube on patient for PTH I and calcium. Plasma was correct but serum was frozen. Typically they cannot use frozen serum but they have mgmt approval to thaw and run lab. They think they can pull the PTH I but may not be able to run for calcium. Please call with questions.   They will call back if pt needs contact for labs to be redrawn.

## 2022-08-08 NOTE — Telephone Encounter (Signed)
Calcium lab is pending.

## 2022-08-10 ENCOUNTER — Encounter: Payer: Self-pay | Admitting: Nurse Practitioner

## 2022-08-10 LAB — TIQ-NTM

## 2022-08-10 LAB — PTH, INTACT AND CALCIUM
Calcium: 9.8 mg/dL (ref 8.6–10.2)
PTH: 48 pg/mL (ref 16–77)

## 2022-08-13 MED ORDER — VITAMIN D (ERGOCALCIFEROL) 1.25 MG (50000 UNIT) PO CAPS: 50000.0000 [IU] | ORAL_CAPSULE | ORAL | 0 refills | Status: AC

## 2022-08-13 NOTE — Addendum Note (Signed)
Addended by: Vance Peper A on: 08/13/2022 07:59 PM   Modules accepted: Orders

## 2022-08-20 ENCOUNTER — Other Ambulatory Visit (HOSPITAL_COMMUNITY)
Admission: RE | Admit: 2022-08-20 | Discharge: 2022-08-20 | Disposition: A | Discharge status: Home / Self Care | Payer: PRIVATE HEALTH INSURANCE | Source: Ambulatory Visit | Attending: Nurse Practitioner | Admitting: Nurse Practitioner

## 2022-08-20 ENCOUNTER — Encounter: Payer: Self-pay | Admitting: Nurse Practitioner

## 2022-08-20 ENCOUNTER — Ambulatory Visit: Payer: PRIVATE HEALTH INSURANCE | Admitting: Nurse Practitioner

## 2022-08-20 VITALS — BP 110/64 | HR 78 | Ht 66.0 in | Wt 227.0 lb

## 2022-08-20 DIAGNOSIS — Z01419 Encounter for gynecological examination (general) (routine) without abnormal findings: Secondary | ICD-10-CM

## 2022-08-20 DIAGNOSIS — Z113 Encounter for screening for infections with a predominantly sexual mode of transmission: Secondary | ICD-10-CM | POA: Diagnosis not present

## 2022-08-20 DIAGNOSIS — Z124 Encounter for screening for malignant neoplasm of cervix: Secondary | ICD-10-CM

## 2022-08-20 DIAGNOSIS — Z803 Family history of malignant neoplasm of breast: Secondary | ICD-10-CM

## 2022-08-20 DIAGNOSIS — Z833 Family history of diabetes mellitus: Secondary | ICD-10-CM | POA: Diagnosis not present

## 2022-08-20 DIAGNOSIS — E282 Polycystic ovarian syndrome: Secondary | ICD-10-CM | POA: Diagnosis not present

## 2022-08-20 MED ORDER — METFORMIN HCL 500 MG PO TABS: 500.0000 mg | ORAL_TABLET | Freq: Three times a day (TID) | ORAL | 2 refills | Status: AC

## 2022-08-20 NOTE — Progress Notes (Signed)
Ashley Ponce 12/24/2000 [id number]   History:  22 y.o. G0 presents as new patient to establish care. Irregular menses. LMP In December 2023. PCOS. Did not tolerate COCs. Did not notice much difference with Metformin but was only taking once daily. S/P 07/2021 parathyroidectomy. Twin sister had breast cancer at age 68. Unsure if she had genetic testing. Has completed Gardasil. Currently being treated for Vit D deficiency by PCP. Being evaluated for lymphadenopathy in left axillary and groin.   Gynecologic History Patient's last menstrual period was 06/22/2022. Period Duration (Days): 3-5 Period Pattern: (!) Irregular Menstrual Flow: Moderate Menstrual Control: Maxi pad Menstrual Control Change Freq (Hours): 4-6 Dysmenorrhea: (!) Mild Dysmenorrhea Symptoms: Cramping, Diarrhea Contraception/Family planning: none Sexually active: Yes  Health Maintenance Last Pap: Never Last mammogram: Not indicated Last colonoscopy: Not indicated Last Dexa: Not indicated   Past medical history, past surgical history, family history and social history were all reviewed and documented in the EPIC chart. Boyfriend of 5 years. 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   ROS:  A ROS was performed and pertinent positives and negatives are included.  Exam:  Vitals:   08/20/22 1446  BP: 110/64  Pulse: 78  SpO2: 100%  Weight: 227 lb (103 kg)  Height: 5' 6"$  (1.676 m)   Body mass index is 36.64 kg/m.  General appearance:  Normal Thyroid:  Symmetrical, normal in size, without palpable masses or nodularity. Respiratory  Auscultation:  Clear without wheezing or rhonchi Cardiovascular  Auscultation:  Regular rate, without rubs, murmurs or gallops  Edema/varicosities:  Not grossly evident Abdominal  Soft,nontender, without masses, guarding or rebound.  Liver/spleen:  No organomegaly noted  Hernia:  None appreciated  Skin  Inspection:  Grossly normal Breasts: Examined lying and  sitting.   Right: Without masses, retractions, nipple discharge or axillary adenopathy.   Left: Without masses, retractions, nipple discharge or axillary adenopathy. Genitourinary   Inguinal/mons:  Normal without inguinal adenopathy  External genitalia:  Normal appearing vulva with no masses, tenderness, or lesions  BUS/Urethra/Skene's glands:  Normal  Vagina:  Normal appearing with normal color and discharge, no lesions  Cervix:  Normal appearing without discharge or lesions  Uterus:  Normal in size, shape and contour.  Midline and mobile, nontender  Adnexa/parametria:     Rt: Normal in size, without masses or tenderness.   Lt: Normal in size, without masses or tenderness.  Anus and perineum: Normal  Digital rectal exam: Deferred  Patient informed chaperone available to be present for breast and pelvic exam. Patient has requested no chaperone to be present. Patient has been advised what will be completed during breast and pelvic exam.   Assessment/Plan:  22 y.o. G0 to establish care.   Well female exam with routine gynecological exam - Education provided on SBEs, importance of preventative screenings, current guidelines, high calcium diet, regular exercise, and multivitamin daily.  Labs with PCP.   Screening for cervical cancer - Plan: Cytology - PAP( [place]). Initial pap today.   PCOS (polycystic ovarian syndrome) - Plan: Hemoglobin A1c, metFORMIN (GLUCOPHAGE) 500 MG tablet TID with meals. Will start once daily with breakfast and increase as tolerated. Irregular menses. LMP in December. Did not tolerate COCs.   Family history of diabetes mellitus - Plan: Hemoglobin A1c  Screening examination for STD (sexually transmitted disease) - Plan: Cytology - PAP( [place]). GC/CT/trich added to pap.   Family history of breast cancer in sister - Twin sister diagnosed at age 24. Lives in another [place] and  is unsure if she had genetic testing completed. Will ask and let us know. Recommend  early breast cancer screenings, genetics referral and SBE.   Return in 1 year for annual.     Tamela Gammon DNP, 3:24 PM 08/20/2022

## 2022-08-21 LAB — HEMOGLOBIN A1C
Hgb A1c MFr Bld: 5 % of total Hgb (ref <5.7)
Mean Plasma Glucose: 97 mg/dL
eAG (mmol/L): 5.4 mmol/L

## 2022-08-23 ENCOUNTER — Other Ambulatory Visit: Payer: Self-pay | Admitting: Nurse Practitioner

## 2022-08-23 DIAGNOSIS — B3731 Acute candidiasis of vulva and vagina: Secondary | ICD-10-CM

## 2022-08-23 LAB — CYTOLOGY - PAP
Chlamydia: NEGATIVE
Comment: NEGATIVE
Comment: NEGATIVE
Comment: NORMAL
Neisseria Gonorrhea: NEGATIVE
Trichomonas: NEGATIVE

## 2022-08-23 MED ORDER — FLUCONAZOLE 150 MG PO TABS: 150.0000 mg | ORAL_TABLET | ORAL | 0 refills | Status: DC

## 2022-08-27 ENCOUNTER — Telehealth: Payer: Self-pay | Admitting: Nurse Practitioner

## 2022-08-27 ENCOUNTER — Ambulatory Visit: Payer: PRIVATE HEALTH INSURANCE | Admitting: Nurse Practitioner

## 2022-08-27 NOTE — Telephone Encounter (Signed)
Pt was a no show for an OV with Lauren on 08/27/22, I sent a letter.

## 2022-09-07 NOTE — Telephone Encounter (Signed)
Noted  

## 2022-09-07 NOTE — Telephone Encounter (Signed)
pt called in stating she got called into work, fee waived, letter sent

## 2022-09-17 ENCOUNTER — Encounter: Payer: Self-pay | Admitting: Nurse Practitioner

## 2022-09-17 ENCOUNTER — Ambulatory Visit: Payer: PRIVATE HEALTH INSURANCE | Admitting: Nurse Practitioner

## 2022-09-17 VITALS — BP 116/80 | HR 104 | Temp 98.7°F | Ht 66.0 in | Wt 226.4 lb

## 2022-09-17 DIAGNOSIS — R599 Enlarged lymph nodes, unspecified: Secondary | ICD-10-CM

## 2022-09-17 DIAGNOSIS — R1032 Left lower quadrant pain: Secondary | ICD-10-CM | POA: Diagnosis not present

## 2022-09-17 MED ORDER — ONDANSETRON HCL 4 MG PO TABS: 4.0000 mg | ORAL_TABLET | Freq: Three times a day (TID) | ORAL | 0 refills | Status: AC | PRN

## 2022-09-17 NOTE — Progress Notes (Signed)
Acute Office Visit  Subjective:     Patient ID: Ashley Ponce, female    DOB: [birth year], 22 y.o.   MRN: [id number]  Chief Complaint  Patient presents with   Abdominal Pain    Nausea and vomiting, LLQ discomfort since Wednesday    HPI Patient is in today for nausea and vomiting that started 5 days ago.  She also has noticed some left lower quadrant discomfort that she describes as a pulsing sensation.  She notes that she still has a swollen lymph node on her left groin and possibly another 1 under her left axilla.  She states that she did have a CT scan in January which did not show anything.  She has tried Pepto-Bismol and over-the-counter nausea medication that did not help.  She denies fevers, constipation, diarrhea.  She is still having nausea and dizziness.  She has been trying to drink some water and armor electrolyte drink.  ROS See pertinent positives and negatives per HPI.     Objective:    BP 116/80 (BP Location: Right Arm)   Pulse (!) 104   Temp 98.7 F (37.1 C)   Ht '5\' 6"'$  (1.676 m)   Wt 226 lb 6.4 oz (102.7 kg)   LMP 06/22/2022 (Approximate)   SpO2 98%   BMI 36.54 kg/m    Physical Exam Vitals and nursing note reviewed.  Constitutional:      General: She is not in acute distress.    Appearance: Normal appearance.  HENT:     Head: Normocephalic.  Eyes:     Conjunctiva/sclera: Conjunctivae normal.  Cardiovascular:     Rate and Rhythm: Normal rate and regular rhythm.     Pulses: Normal pulses.     Heart sounds: Normal heart sounds.  Pulmonary:     Effort: Pulmonary effort is normal.     Breath sounds: Normal breath sounds.  Abdominal:     General: Bowel sounds are normal. There is no distension.     Palpations: Abdomen is soft. There is no mass.     Tenderness: There is abdominal tenderness (LLQ). There is no guarding.     Hernia: No hernia is present.  Musculoskeletal:     Cervical back: Normal range of motion.  Skin:    General: Skin is warm.   Neurological:     General: No focal deficit present.     Mental Status: She is alert and oriented to person, place, and time.  Psychiatric:        Mood and Affect: Mood normal.        Behavior: Behavior normal.        Thought Content: Thought content normal.        Judgment: Judgment normal.      Assessment & Plan:   Problem List Items Addressed This Visit       Immune and Lymphatic   Swelling of lymph node    She still has a swelling to her left groin.  Will check an ultrasound of soft tissue to verify if it is a lymph node.  Check CMP, CBC.      Relevant Orders   [place] LT LOWER EXTREM LTD SOFT TISSUE NON VASCULAR     Other   LLQ pain - Primary    She is having a pulsing/aching sensation in her left lower quadrant.  This is associated with nausea and vomiting.  She denies constipation/diarrhea.  No red flags on exam today.  She had a CT scan in  January 2024 which was negative.  Will check an ultrasound of her artery to rule out aneurysm.  Will give her Zofran 4 mg every 8 hours as needed for nausea.  Encouraged her to continue drinking fluids.  Referral placed to GI.  Check CMP, CBC today.      Relevant Orders   Ambulatory referral to Gastroenterology   US AORTA   CBC with Differential/Platelet   Comprehensive metabolic panel    Meds ordered this encounter  Medications   ondansetron (ZOFRAN) 4 MG tablet    Sig: Take 1 tablet (4 mg total) by mouth every 8 (eight) hours as needed for nausea or vomiting.    Dispense:  30 tablet    Refill:  0    Return in about 4 weeks (around 10/15/2022) for abdominal pain.  Charyl Dancer, NP

## 2022-09-17 NOTE — Patient Instructions (Signed)
It was great to see you!  I have ordered an ultrasound of your abdomen and groin, they will call to schedule.   Start zofran every 8 hours as needed for nausea.   We are checking your labs today and will let you know the results via mychart/phone.   I have placed a referral to GI.   Let's follow-up in 4 weeks, sooner if you have concerns.  If a referral was placed today, you will be contacted for an appointment. Please note that routine referrals can sometimes take up to 3-4 weeks to process. Please call our office if you haven't heard anything after this time frame.  Take care,  Vance Peper, NP

## 2022-09-17 NOTE — Assessment & Plan Note (Signed)
She is having a pulsing/aching sensation in her left lower quadrant.  This is associated with nausea and vomiting.  She denies constipation/diarrhea.  No red flags on exam today.  She had a CT scan in January 2024 which was negative.  Will check an ultrasound of her artery to rule out aneurysm.  Will give her Zofran 4 mg every 8 hours as needed for nausea.  Encouraged her to continue drinking fluids.  Referral placed to GI.  Check CMP, CBC today.

## 2022-09-17 NOTE — Assessment & Plan Note (Signed)
She still has a swelling to her left groin.  Will check an ultrasound of soft tissue to verify if it is a lymph node.  Check CMP, CBC.

## 2022-09-18 ENCOUNTER — Telehealth: Payer: Self-pay | Admitting: Nurse Practitioner

## 2022-09-18 LAB — CBC WITH DIFFERENTIAL/PLATELET
Basophils Absolute: 0.1 10*3/uL (ref 0.0–0.1)
Basophils Relative: 0.7 % (ref 0.0–3.0)
Eosinophils Absolute: 0.1 10*3/uL (ref 0.0–0.7)
Eosinophils Relative: 1.1 % (ref 0.0–5.0)
HCT: 45.1 % (ref 36.0–46.0)
Hemoglobin: 15.4 g/dL — ABNORMAL HIGH (ref 12.0–15.0)
Lymphocytes Relative: 28 % (ref 12.0–46.0)
Lymphs Abs: 2.2 10*3/uL (ref 0.7–4.0)
MCHC: 34.2 g/dL (ref 30.0–36.0)
MCV: 93.8 fl (ref 78.0–100.0)
Monocytes Absolute: 0.7 10*3/uL (ref 0.1–1.0)
Monocytes Relative: 9.3 % (ref 3.0–12.0)
Neutro Abs: 4.8 10*3/uL (ref 1.4–7.7)
Neutrophils Relative %: 60.9 % (ref 43.0–77.0)
Platelets: 423 10*3/uL — ABNORMAL HIGH (ref 150.0–400.0)
RBC: 4.81 Mil/uL (ref 3.87–5.11)
RDW: 13.8 % (ref 11.5–15.5)
WBC: 7.8 10*3/uL (ref 4.0–10.5)

## 2022-09-18 LAB — COMPREHENSIVE METABOLIC PANEL
ALT: 35 U/L (ref 0–35)
AST: 18 U/L (ref 0–37)
Albumin: 4.4 g/dL (ref 3.5–5.2)
Alkaline Phosphatase: 83 U/L (ref 39–117)
BUN: 11 mg/dL (ref 6–23)
CO2: 27 mEq/L (ref 19–32)
Calcium: 10.2 mg/dL (ref 8.4–10.5)
Chloride: 102 mEq/L (ref 96–112)
Creatinine, Ser: 0.79 mg/dL (ref 0.40–1.20)
GFR: 105.51 mL/min (ref >60.00)
Glucose, Bld: 65 mg/dL — ABNORMAL LOW (ref 70–99)
Potassium: 4.3 mEq/L (ref 3.5–5.1)
Sodium: 138 mEq/L (ref 135–145)
Total Bilirubin: 0.5 mg/dL (ref 0.2–1.2)
Total Protein: 7.2 g/dL (ref 6.0–8.3)

## 2022-09-18 NOTE — Telephone Encounter (Signed)
Caller Name: Rajah  Call back phone #: [phone]  Reason for Call: Pt called radiology to schedule her appointment. She was told it would be 4 weeks before she could be seen. She is requesting a different office that will be able to schedule sooner.

## 2022-09-19 ENCOUNTER — Ambulatory Visit (HOSPITAL_BASED_OUTPATIENT_CLINIC_OR_DEPARTMENT_OTHER)
Admission: RE | Admit: 2022-09-19 | Discharge: 2022-09-19 | Disposition: A | Discharge status: Home / Self Care | Payer: PRIVATE HEALTH INSURANCE | Source: Ambulatory Visit | Attending: Nurse Practitioner | Admitting: Nurse Practitioner

## 2022-09-19 DIAGNOSIS — R599 Enlarged lymph nodes, unspecified: Secondary | ICD-10-CM

## 2022-09-19 DIAGNOSIS — R1032 Left lower quadrant pain: Secondary | ICD-10-CM

## 2022-09-19 NOTE — Telephone Encounter (Signed)
Pt called to check status. Willing to go to any location and not concerned with the cost of facility. I called [facility] and they can see her this afternoon. Pt has been notified and will cancel at [facility] and call [facility] to get scheduled.

## 2022-09-27 ENCOUNTER — Other Ambulatory Visit: Payer: PRIVATE HEALTH INSURANCE

## 2022-10-17 ENCOUNTER — Other Ambulatory Visit: Payer: PRIVATE HEALTH INSURANCE

## 2022-10-24 ENCOUNTER — Ambulatory Visit: Payer: PRIVATE HEALTH INSURANCE | Admitting: Nurse Practitioner

## 2022-10-30 ENCOUNTER — Ambulatory Visit (INDEPENDENT_AMBULATORY_CARE_PROVIDER_SITE_OTHER): Payer: PRIVATE HEALTH INSURANCE | Admitting: Nurse Practitioner

## 2022-10-30 ENCOUNTER — Encounter: Payer: Self-pay | Admitting: Nurse Practitioner

## 2022-10-30 VITALS — BP 108/68 | Resp 14 | Ht 68.0 in | Wt 226.0 lb

## 2022-10-30 DIAGNOSIS — N911 Secondary amenorrhea: Secondary | ICD-10-CM

## 2022-10-30 DIAGNOSIS — E282 Polycystic ovarian syndrome: Secondary | ICD-10-CM | POA: Diagnosis not present

## 2022-10-30 LAB — PREGNANCY, URINE: Preg Test, Ur: NEGATIVE

## 2022-10-30 MED ORDER — MEDROXYPROGESTERONE ACETATE 10 MG PO TABS
10.0000 mg | ORAL_TABLET | Freq: Every day | ORAL | 0 refills | Status: DC
Start: 2022-10-30 — End: 2022-11-13

## 2022-10-30 NOTE — Progress Notes (Signed)
   Acute Office Visit  Subjective:    Patient ID: Ashley Ponce, female    DOB: 2001/06/25, 22 y.o.   MRN: [id number]   HPI 22 y.o. G0 presents today for amenorrhea. LMP December 2023. H/O PCOS. Restarted Metformin in February, has increased to TID and tolerating well. Did not tolerate COCs in the past due to mood changes. 07/2021 parathyroidectomy. Normal TSH in January. Complaining of LLQ pain that has progressed over the last month. Has seen PCP with referral in process for GI. Also having nausea, taking OTC emetrol.   Patient's last menstrual period was 06/22/2022.    Review of Systems  Constitutional: Negative.   Genitourinary:  Positive for menstrual problem.       Objective:    Physical Exam Constitutional:      Appearance: Normal appearance.   GU: Not indicated  BP 108/68   Resp 14   Ht  (1.727 m)   Wt 226 lb (102.5 kg)   LMP 06/22/2022   BMI 34.36 kg/m  Wt Readings from Last 3 Encounters:  10/30/22 226 lb (102.5 kg)  09/17/22 226 lb 6.4 oz (102.7 kg)  08/20/22 227 lb (103 kg)        Patient informed chaperone available to be present for breast and/or pelvic exam. Patient has requested no chaperone to be present. Patient has been advised what will be completed during breast and pelvic exam.   Negative UPT  Assessment & Plan:   Problem List Items Addressed This Visit       Endocrine   PCOS (polycystic ovarian syndrome)   Other Visit Diagnoses     Irregular periods    -  Primary   Secondary amenorrhea       Relevant Medications   medroxyPROGESTERone (PROVERA) 10 MG tablet   Other Relevant Orders   Pregnancy, urine      Plan: Provera withdrawal challenge. Will reach out if no menses. Continue Metformin TID. UPT negative today. Discussed option to add POPs and will consider.      Olivia Mackie DNP, 3:42 PM 10/30/2022

## 2022-11-13 ENCOUNTER — Other Ambulatory Visit: Payer: Self-pay | Admitting: Nurse Practitioner

## 2022-11-13 ENCOUNTER — Encounter: Payer: Self-pay | Admitting: Nurse Practitioner

## 2022-11-13 DIAGNOSIS — E282 Polycystic ovarian syndrome: Secondary | ICD-10-CM

## 2022-11-13 DIAGNOSIS — Z30011 Encounter for initial prescription of contraceptive pills: Secondary | ICD-10-CM

## 2022-11-13 MED ORDER — NORETHINDRONE 0.35 MG PO TABS
1.0000 | ORAL_TABLET | Freq: Every day | ORAL | 2 refills | Status: AC
Start: 2022-11-13 — End: ?

## 2022-11-29 ENCOUNTER — Emergency Department
Admission: EM | Admit: 2022-11-29 | Discharge: 2022-11-29 | Disposition: A | Discharge status: Home / Self Care | Payer: PRIVATE HEALTH INSURANCE | Attending: Emergency Medicine | Admitting: Emergency Medicine

## 2022-11-29 ENCOUNTER — Emergency Department: Payer: PRIVATE HEALTH INSURANCE

## 2022-11-29 ENCOUNTER — Other Ambulatory Visit: Payer: Self-pay

## 2022-11-29 ENCOUNTER — Encounter: Payer: Self-pay | Admitting: Emergency Medicine

## 2022-11-29 DIAGNOSIS — R110 Nausea: Secondary | ICD-10-CM | POA: Insufficient documentation

## 2022-11-29 DIAGNOSIS — R1032 Left lower quadrant pain: Secondary | ICD-10-CM | POA: Diagnosis present

## 2022-11-29 DIAGNOSIS — N926 Irregular menstruation, unspecified: Secondary | ICD-10-CM | POA: Diagnosis not present

## 2022-11-29 DIAGNOSIS — E282 Polycystic ovarian syndrome: Secondary | ICD-10-CM

## 2022-11-29 LAB — URINALYSIS, ROUTINE W REFLEX MICROSCOPIC
Bilirubin Urine: NEGATIVE
Glucose, UA: NEGATIVE mg/dL
Ketones, ur: NEGATIVE mg/dL
Nitrite: NEGATIVE
Protein, ur: 30 mg/dL — AB
Specific Gravity, Urine: 1.03 (ref 1.005–1.030)
pH: 5 (ref 5.0–8.0)

## 2022-11-29 LAB — CBC WITH DIFFERENTIAL/PLATELET
Abs Immature Granulocytes: 0.02 10*3/uL (ref 0.00–0.07)
Basophils Absolute: 0 10*3/uL (ref 0.0–0.1)
Basophils Relative: 1 %
Eosinophils Absolute: 0.1 10*3/uL (ref 0.0–0.5)
Eosinophils Relative: 2 %
HCT: 46.2 % — ABNORMAL HIGH (ref 36.0–46.0)
Hemoglobin: 15.8 g/dL — ABNORMAL HIGH (ref 12.0–15.0)
Immature Granulocytes: 0 %
Lymphocytes Relative: 33 %
Lymphs Abs: 2.1 10*3/uL (ref 0.7–4.0)
MCH: 31.8 pg (ref 26.0–34.0)
MCHC: 34.2 g/dL (ref 30.0–36.0)
MCV: 93 fL (ref 80.0–100.0)
Monocytes Absolute: 0.5 10*3/uL (ref 0.1–1.0)
Monocytes Relative: 8 %
Neutro Abs: 3.6 10*3/uL (ref 1.7–7.7)
Neutrophils Relative %: 56 %
Platelets: 404 10*3/uL — ABNORMAL HIGH (ref 150–400)
RBC: 4.97 MIL/uL (ref 3.87–5.11)
RDW: 13.1 % (ref 11.5–15.5)
WBC: 6.3 10*3/uL (ref 4.0–10.5)
nRBC: 0 % (ref 0.0–0.2)

## 2022-11-29 LAB — COMPREHENSIVE METABOLIC PANEL
ALT: 41 U/L (ref 0–44)
AST: 26 U/L (ref 15–41)
Albumin: 4.6 g/dL (ref 3.5–5.0)
Alkaline Phosphatase: 72 U/L (ref 38–126)
Anion gap: 8 (ref 5–15)
BUN: 11 mg/dL (ref 6–20)
CO2: 24 mmol/L (ref 22–32)
Calcium: 9.1 mg/dL (ref 8.9–10.3)
Chloride: 108 mmol/L (ref 98–111)
Creatinine, Ser: 0.77 mg/dL (ref 0.44–1.00)
GFR, Estimated: >60 mL/min (ref >60)
Glucose, Bld: 105 mg/dL — ABNORMAL HIGH (ref 70–99)
Potassium: 3.5 mmol/L (ref 3.5–5.1)
Sodium: 140 mmol/L (ref 135–145)
Total Bilirubin: 0.7 mg/dL (ref 0.3–1.2)
Total Protein: 7.8 g/dL (ref 6.5–8.1)

## 2022-11-29 LAB — WET PREP, GENITAL
Clue Cells Wet Prep HPF POC: NONE SEEN
Sperm: NONE SEEN
Trich, Wet Prep: NONE SEEN
WBC, Wet Prep HPF POC: <10 (ref <10)
Yeast Wet Prep HPF POC: NONE SEEN

## 2022-11-29 LAB — CHLAMYDIA/NGC RT PCR (ARMC ONLY)
Chlamydia Tr: NOT DETECTED
N gonorrhoeae: NOT DETECTED

## 2022-11-29 LAB — LIPASE, BLOOD: Lipase: 37 U/L (ref 11–51)

## 2022-11-29 LAB — POC URINE PREG, ED: Preg Test, Ur: NEGATIVE

## 2022-11-29 MED ORDER — ONDANSETRON 4 MG PO TBDP
4.0000 mg | ORAL_TABLET | Freq: Once | ORAL | Status: AC
  Administered 2022-11-29: 4 mg via ORAL
  Filled 2022-11-29: qty 1

## 2022-11-29 NOTE — ED Notes (Signed)
See triage note  Presents with lower abd pain since yesterday  Pain s mainly on the left  Positive n/v

## 2022-11-29 NOTE — Discharge Instructions (Addendum)
Call your gynecologist for a follow-up appointment this week.  Take acetaminophen 650 mg and ibuprofen 400 mg every 6 hours for pain.  Take with food.  For the results of your STI testing and follow-up with your gynecologist or primary doctor to discuss these results and treatments as needed.  If you have any new, unexpected, worsening symptoms come back to the emergency department for reevaluation.

## 2022-11-29 NOTE — ED Provider Notes (Addendum)
[facility] Provider Note    Event Date/Time   First MD Initiated Contact with Patient 11/29/22 [phone]     (approximate)   History   Abdominal Pain   HPI  Ashley Ponce is a 22 y.o. female   Past medical history of polycystic ovarian syndrome, here with left lower quadrant pain starting yesterday along with some nausea and mild brown vaginal discharge/bleeding.  No inciting event.  She did undergo Provera withdrawal challenge several weeks ago for irregular menses and had a very heavy period approximately 2 weeks ago.  She denies any other urinary symptoms like dysuria or frequency.  No fevers or chills.  No changes in bowel movement.  External Medical Documents Reviewed: Gynecology note from 10/30/2022 for amenorrhea at that time plan was to do Provera withdrawal challenge      Physical Exam   Triage Vital Signs: ED Triage Vitals [11/29/22 0656]  Enc Vitals Group     BP      Pulse      Resp      Temp      Temp src      SpO2      Weight 226 lb (102.5 kg)     Height 5\' 7"  (1.702 m)     Head Circumference      Peak Flow      Pain Score 6     Pain Loc      Pain Edu?      Excl. in GC?     Most recent vital signs: Vitals:   11/29/22 0808  BP: 122/77  Pulse: 88  Resp: 17  Temp: 98 F (36.7 C)  SpO2: 99%    General: Awake, no distress.  CV:  Good peripheral perfusion.  Resp:  Normal effort.  Abd:  No distention.  Other:  Soft nontender abdomen.  Pelvic exam shows no active bleeding, some scant blood clots in the vault, no significant discharge, no trauma noted.   ED Results / Procedures / Treatments   Labs (all labs ordered are listed, but only abnormal results are displayed) Labs Reviewed  CBC WITH DIFFERENTIAL/PLATELET - Abnormal; Notable for the following components:      Result Value   Hemoglobin 15.8 (*)    HCT 46.2 (*)    Platelets 404 (*)    All other components within normal limits  COMPREHENSIVE METABOLIC PANEL -  Abnormal; Notable for the following components:   Glucose, Bld 105 (*)    All other components within normal limits  URINALYSIS, ROUTINE W REFLEX MICROSCOPIC - Abnormal; Notable for the following components:   Color, Urine YELLOW (*)    APPearance CLOUDY (*)    Hgb urine dipstick LARGE (*)    Protein, ur 30 (*)    Leukocytes,Ua MODERATE (*)    Bacteria, UA RARE (*)    All other components within normal limits  CHLAMYDIA/NGC RT PCR ([facility] ONLY)            WET PREP, GENITAL  LIPASE, BLOOD  POC URINE PREG, ED     I ordered and reviewed the above labs they are notable for contaminated urine sample that shows some leukocytes and bacteria as well as epithelial cells.  Normal H&H.  Radiology -I reviewed and independently interpreted the pelvic ultrasound see polycystic ovaries   PROCEDURES:  Critical Care performed: No  Procedures   MEDICATIONS ORDERED IN ED: Medications  ondansetron (ZOFRAN-ODT) disintegrating tablet 4 mg (4 mg Oral Given 11/29/22 0806)  IMPRESSION / MDM / ASSESSMENT AND PLAN / ED COURSE  I reviewed the triage vital signs and the nursing notes.                                Patient's presentation is most consistent with acute presentation with potential threat to life or bodily function.  Differential diagnosis includes, but is not limited to, adverse effect of medication, menstrual cramping, menstrual bleeding, STI, ovarian torsion, ovarian cyst, ectopic pregnancy or other pregnancy related, urinary tract infection   The patient is on the cardiac monitor to evaluate for evidence of arrhythmia and/or significant heart rate changes.  MDM: This is a patient with left lower quadrant pain history of PCOS so obtain transvaginal ultrasound for rule out torsion.  May be result of her recent medication changes to OCP in the setting of amenorrhea.  Doubt urinary tract infection given contaminated sample and no dysuria frequency flank pain or fever or white blood  cell count elevation.  She is not pregnant.  I doubt other intra-abdominal surgical pathologies given her benign abdominal exam.  If transvaginal ultrasound is unrevealing for emergent pathology she will be discharged with gynecology follow-up.       FINAL CLINICAL IMPRESSION(S) / ED DIAGNOSES   Final diagnoses:  Irregular menstruation  LLQ pain     Rx / DC Orders   ED Discharge Orders     None        Note:  This document was prepared using Dragon voice recognition software and may include unintentional dictation errors.    Pilar Jarvis, MD 11/29/22 1610    Pilar Jarvis, MD 11/29/22 1011

## 2022-11-29 NOTE — ED Triage Notes (Signed)
Patient ambulatory to triage with steady gait, without difficulty or distress noted; pt reports left lower abd pain since yesterday accomp by nausea and brown vag discharge
